# Patient Record
Sex: Male | Born: 1960 | Race: White | Hispanic: No | Marital: Married | Smoking: Never smoker
Health system: Southern US, Community
[De-identification: ages and names within clinical notes are randomized; demographics above are authoritative.]

## PROBLEM LIST (undated history)

## (undated) DIAGNOSIS — M7700 Medial epicondylitis, unspecified elbow: Secondary | ICD-10-CM

## (undated) DIAGNOSIS — IMO0001 Reserved for inherently not codable concepts without codable children: Secondary | ICD-10-CM

## (undated) DIAGNOSIS — M545 Low back pain, unspecified: Secondary | ICD-10-CM

## (undated) DIAGNOSIS — K635 Polyp of colon: Secondary | ICD-10-CM

## (undated) DIAGNOSIS — H8101 Meniere's disease, right ear: Secondary | ICD-10-CM

## (undated) DIAGNOSIS — K219 Gastro-esophageal reflux disease without esophagitis: Secondary | ICD-10-CM

## (undated) DIAGNOSIS — K649 Unspecified hemorrhoids: Secondary | ICD-10-CM

## (undated) HISTORY — DX: Gastro-esophageal reflux disease without esophagitis: K21.9

## (undated) HISTORY — DX: Low back pain: M54.5

## (undated) HISTORY — DX: Reserved for inherently not codable concepts without codable children: IMO0001

## (undated) HISTORY — DX: Polyp of colon: K63.5

## (undated) HISTORY — PX: WISDOM TOOTH EXTRACTION: SHX21

## (undated) HISTORY — DX: Meniere's disease, right ear: H81.01

## (undated) HISTORY — DX: Unspecified hemorrhoids: K64.9

## (undated) HISTORY — DX: Medial epicondylitis, unspecified elbow: M77.00

## (undated) HISTORY — DX: Low back pain, unspecified: M54.50

---

## 2002-06-04 HISTORY — PX: COLONOSCOPY: SHX174

## 2003-01-08 DIAGNOSIS — H8101 Meniere's disease, right ear: Secondary | ICD-10-CM

## 2003-01-08 HISTORY — DX: Meniere's disease, right ear: H81.01

## 2003-05-31 ENCOUNTER — Ambulatory Visit (HOSPITAL_COMMUNITY): Admission: RE | Admit: 2003-05-31 | Discharge: 2003-05-31 | Payer: Self-pay | Admitting: Internal Medicine

## 2004-12-25 IMAGING — US US ABDOMEN COMPLETE
1 series · 14 of 25 positions shown · non-contrast
Comparison: none

CLINICAL DATA: Patient has abdominal pain.  
ULTRASOUND OF THE ABDOMEN COMPLETE
The gallbladder is not optimally dilated  without  definite stones.  Gallbladder wall is normal.  Common bile duct is normal measuring 4-5 mm.  Liver, IVC, pancreas and spleen are unremarkable.  Right kidney measures 10.2 cm and the left 10.8 cm.  No hydronephrosis.  Aorta is normal measuring 2 cm.  
IMPRESSION 
There is no abnormality.  No gallstones.

[Series 1: unknown · 0.33mm/px · 14 of 89 slices shown]
[im 1/89]
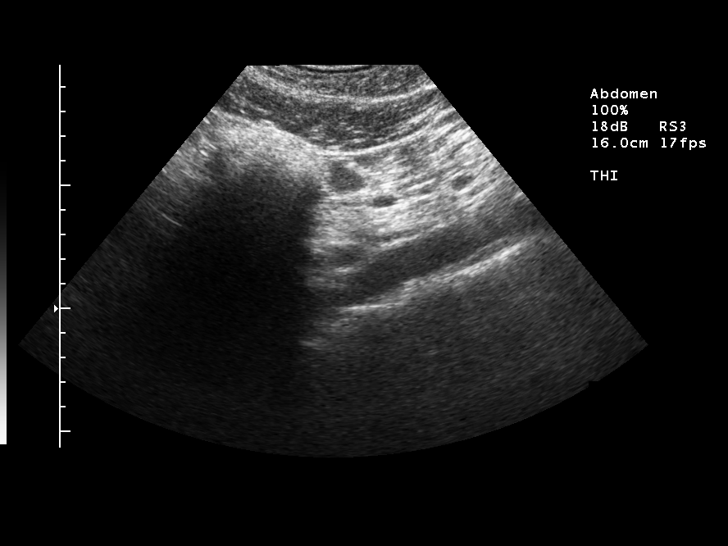
[im 8/89]
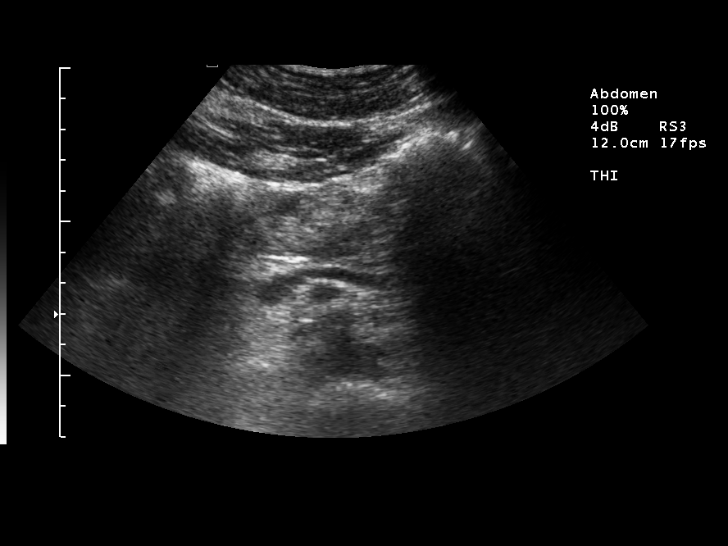
[im 15/89]
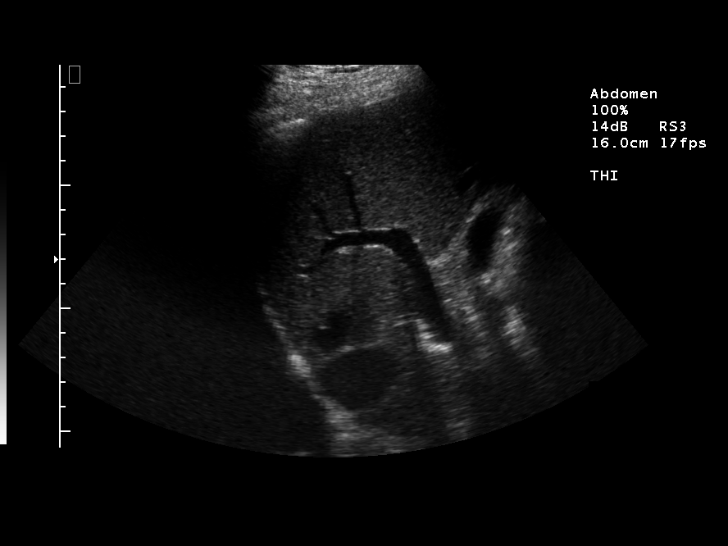
[im 23/89]
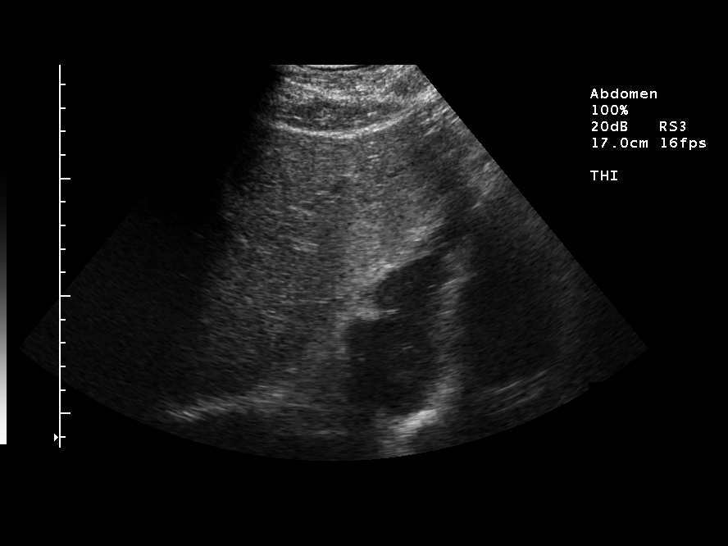
[im 30/89]
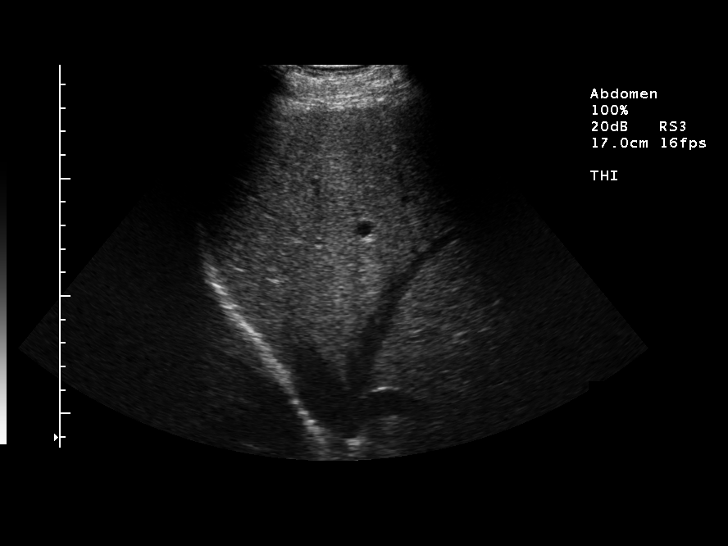
[im 34/89]
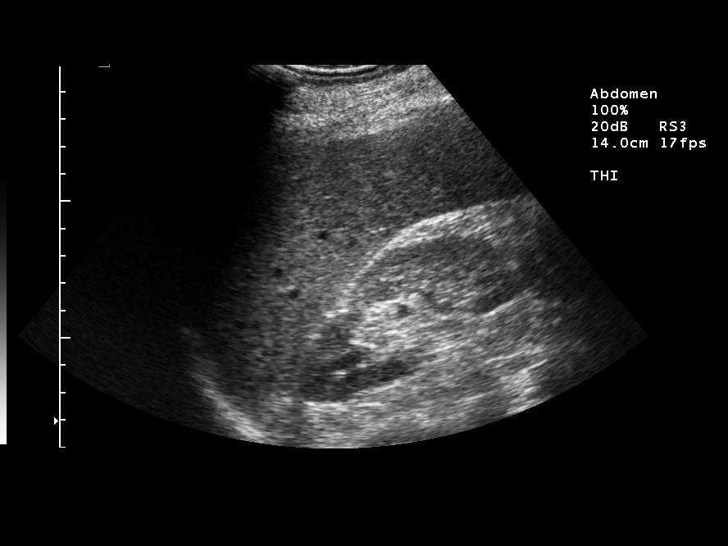
[im 41/89]
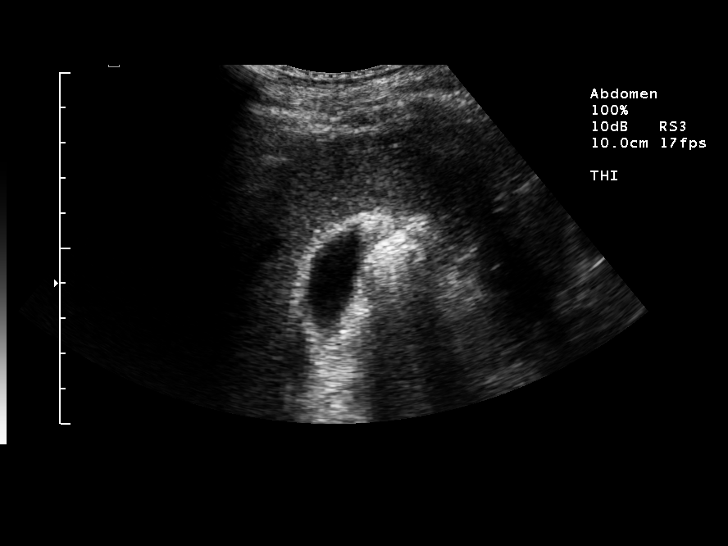
[im 48/89]
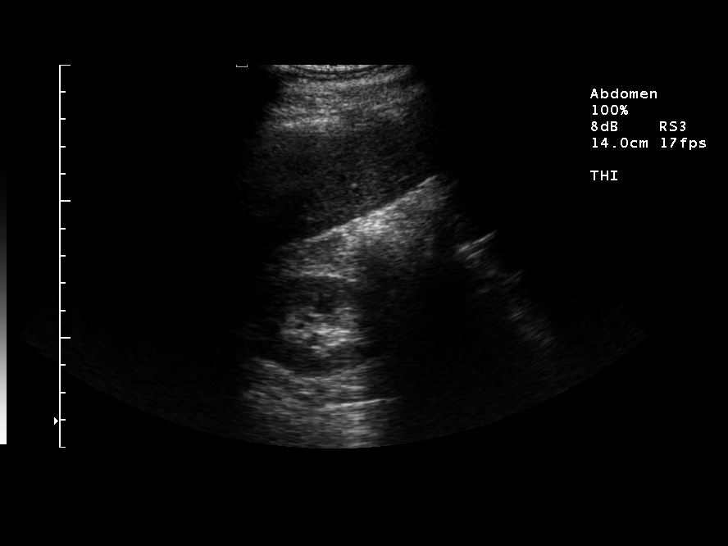
[im 56/89]
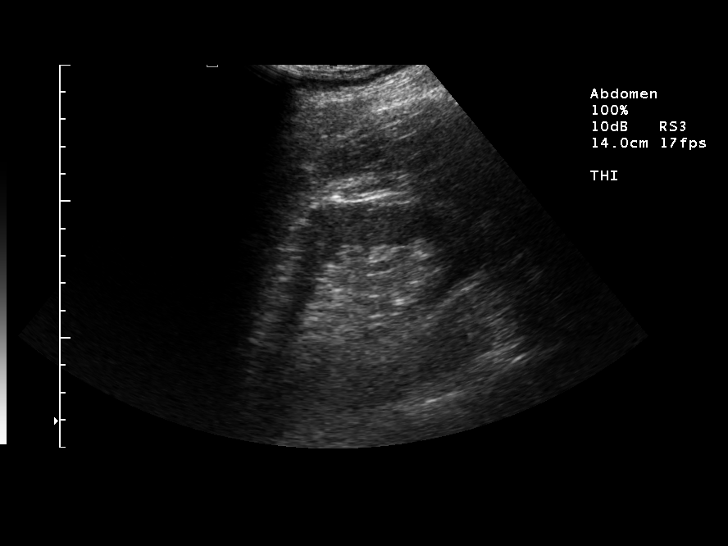
[im 59/89]
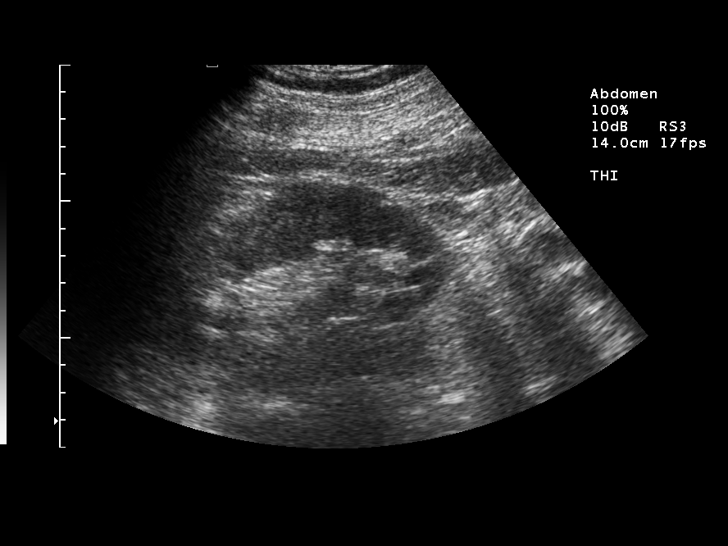
[im 67/89]
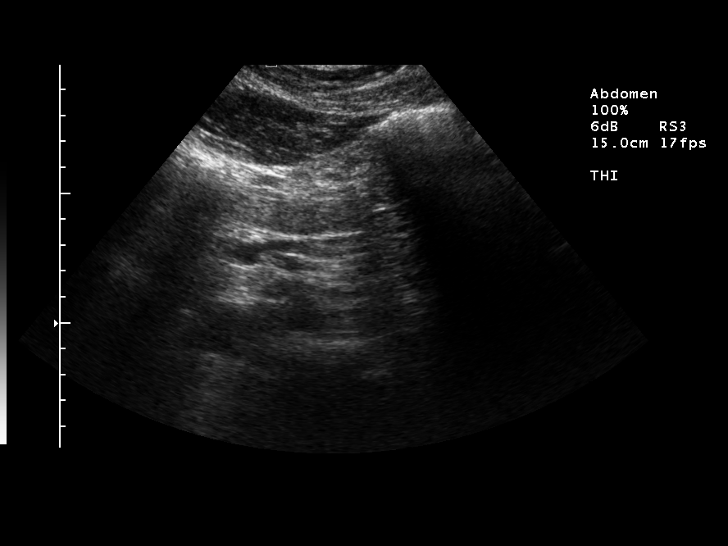
[im 74/89]
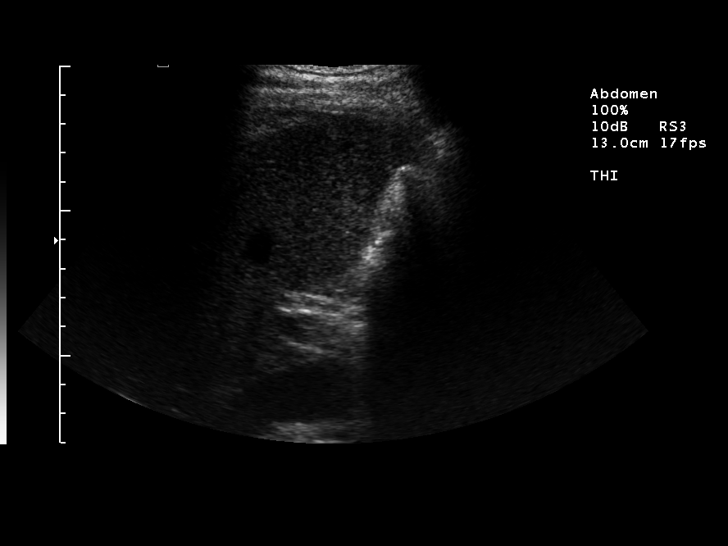
[im 81/89]
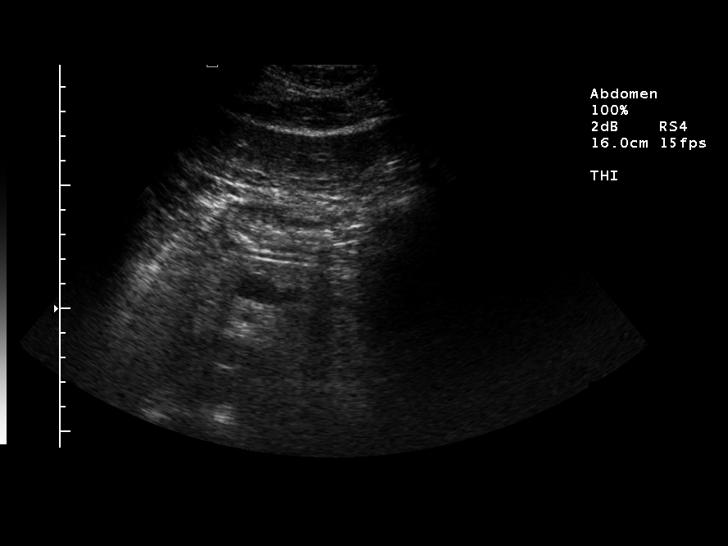
[im 89/89]
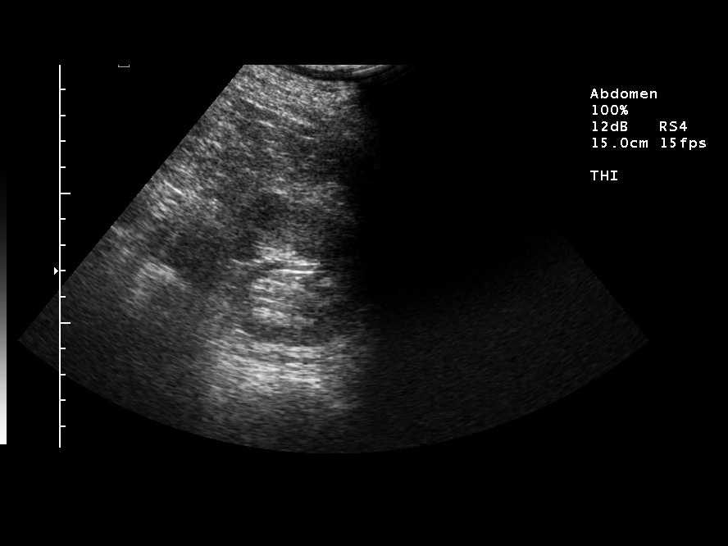

[14 of 25 positions shown; findings below may reference images not displayed]

## 2008-07-29 ENCOUNTER — Encounter: Admission: RE | Admit: 2008-07-29 | Discharge: 2008-07-29 | Payer: Self-pay | Admitting: Orthopedic Surgery

## 2008-08-23 ENCOUNTER — Encounter: Admission: RE | Admit: 2008-08-23 | Discharge: 2008-08-23 | Payer: Self-pay | Admitting: Orthopedic Surgery

## 2010-01-28 ENCOUNTER — Encounter: Payer: Self-pay | Admitting: Internal Medicine

## 2010-01-29 ENCOUNTER — Encounter: Payer: Self-pay | Admitting: Orthopedic Surgery

## 2012-10-09 ENCOUNTER — Ambulatory Visit (INDEPENDENT_AMBULATORY_CARE_PROVIDER_SITE_OTHER): Payer: BC Managed Care – PPO | Admitting: Psychology

## 2012-10-09 DIAGNOSIS — F331 Major depressive disorder, recurrent, moderate: Secondary | ICD-10-CM

## 2012-10-22 ENCOUNTER — Ambulatory Visit (INDEPENDENT_AMBULATORY_CARE_PROVIDER_SITE_OTHER): Payer: BC Managed Care – PPO | Admitting: Psychology

## 2012-10-22 DIAGNOSIS — F331 Major depressive disorder, recurrent, moderate: Secondary | ICD-10-CM

## 2012-12-11 ENCOUNTER — Ambulatory Visit (INDEPENDENT_AMBULATORY_CARE_PROVIDER_SITE_OTHER): Payer: BC Managed Care – PPO | Admitting: Psychology

## 2012-12-11 DIAGNOSIS — F331 Major depressive disorder, recurrent, moderate: Secondary | ICD-10-CM

## 2012-12-24 ENCOUNTER — Encounter: Payer: Self-pay | Admitting: General Surgery

## 2012-12-24 ENCOUNTER — Ambulatory Visit (INDEPENDENT_AMBULATORY_CARE_PROVIDER_SITE_OTHER): Payer: BC Managed Care – PPO | Admitting: Cardiology

## 2012-12-24 VITALS — BP 140/86 | HR 58 | Ht 70.5 in | Wt 168.0 lb

## 2012-12-24 DIAGNOSIS — R002 Palpitations: Secondary | ICD-10-CM

## 2012-12-24 DIAGNOSIS — R079 Chest pain, unspecified: Secondary | ICD-10-CM | POA: Insufficient documentation

## 2012-12-24 NOTE — Patient Instructions (Addendum)
Dr. Mayford Knife would like for you to have your heart monitor put on today.  Your physician recommends that you schedule a follow-up appointment during the first week on January 2015.

## 2012-12-28 ENCOUNTER — Telehealth: Payer: Self-pay | Admitting: Cardiology

## 2012-12-28 NOTE — Telephone Encounter (Signed)
Please ask one of the monitor techs to call him to answer his questions

## 2012-12-28 NOTE — Telephone Encounter (Signed)
To Burnett Harry and Orpha Bur to advise

## 2012-12-28 NOTE — Telephone Encounter (Signed)
New Problem:  Pt states he has questions about his heart monitor. Pt states [place]. Pt is asking if that will pose a problem. Pt would like a call back.

## 2012-12-28 NOTE — Telephone Encounter (Signed)
To Dr Mayford Knife to advise. OK to take out of country?

## 2012-12-29 ENCOUNTER — Encounter: Payer: Self-pay | Admitting: *Deleted

## 2012-12-29 ENCOUNTER — Encounter (INDEPENDENT_AMBULATORY_CARE_PROVIDER_SITE_OTHER): Payer: BC Managed Care – PPO

## 2012-12-29 DIAGNOSIS — R002 Palpitations: Secondary | ICD-10-CM

## 2012-12-29 NOTE — Progress Notes (Signed)
Patient ID: Oscar Niemann., male   DOB: 1960-06-04, 52 y.o.   MRN: [id number] [employer] 30 day cardiac event monitor applied to patient.  Patient will be out of country 12/31/2012 thru 01/04/2014 and off monitor device.

## 2013-01-04 ENCOUNTER — Encounter: Payer: Self-pay | Admitting: General Surgery

## 2013-01-15 ENCOUNTER — Ambulatory Visit (INDEPENDENT_AMBULATORY_CARE_PROVIDER_SITE_OTHER): Payer: BC Managed Care – PPO | Admitting: Cardiology

## 2013-01-15 ENCOUNTER — Encounter: Payer: Self-pay | Admitting: Cardiology

## 2013-01-15 VITALS — BP 130/84 | HR 51 | Ht 69.5 in | Wt 177.0 lb

## 2013-01-15 DIAGNOSIS — R002 Palpitations: Secondary | ICD-10-CM

## 2013-01-15 NOTE — Progress Notes (Signed)
  [address] [place], [place]  [address] Phone: [phone] Fax:  [phone]) [phone]  Date:  01/15/2013   ID:  Oscar Niemannlbert R Belasco Jr., DOB 1960/11/15, MRN [id number]  PCP:  Pearla DubonnetGATES,ROBERT NEVILL, MD  Cardiologist:  Armanda Magicraci Valente Fosberg, MD     History of Present Illness:  This is a 53yo WM with a history of atypical CP in 2012 with normal nusclear stress test who called in a few weeks ago with new onset palpitations.  An event monitor was ordered and he presents back today for followup.  After starting to wear the heart monitor he has not had any further palpitations.  He drinks 2 cups of coffee daily.  He denies any chest pain or SOB.     Wt Readings from Last 3 Encounters:  01/04/13 177 lb (80.287 kg)  12/24/12 177 lb (80.287 kg)  12/24/12 168 lb (76.204 kg)     Past Medical History  Diagnosis Date  . Hemorrhoids   . Colon polyp     normal colonoscopy 06/04/2002/ Latest colonscopy per Dr Ewing SchleinMagod  . Reflux   . Lower back pain   . Medial epicondylitis     Left  . Endolymphatic hydrops of right ear 2005    Current Outpatient Prescriptions  Medication Sig Dispense Refill  . aspirin 81 MG tablet Take 81 mg by mouth daily.      . pantoprazole (PROTONIX) 40 MG tablet Take 40 mg by mouth as needed.      . rosuvastatin (CRESTOR) 10 MG tablet Take 10 mg by mouth daily.       No current facility-administered medications for this visit.    Allergies:   No Known Allergies  Social History:  The patient  reports that he has never smoked. He does not have any smokeless tobacco history on file. He reports that he drinks alcohol. He reports that he does not use illicit drugs.   Family History:  The patient's family history includes Coronary artery disease in his father.   ROS:  Please see the history of present illness.      All other systems reviewed and negative.   PHYSICAL EXAM: VS:  There were no vitals taken for this visit. Well nourished, well developed, in no acute distress HEENT:  normal Neck: no JVD Cardiac:  normal S1, S2; RRR; no murmur Lungs:  clear to auscultation bilaterally, no wheezing, rhonchi or rales Abd: soft, nontender, no hepatomegaly Ext: no edema Skin: warm and dry Neuro:  CNs 2-12 intact, no focal abnormalities noted    ASSESSMENT AND PLAN:  1. Palpitations - event monitor results pending  - I will call him with the results.  The palpitations have resolved at this time  Followup with me PRN  Signed, Armanda Magicraci Makenli Derstine, MD 01/15/2013 8:55 AM

## 2013-01-15 NOTE — Patient Instructions (Signed)
Your physician recommends that you continue on your current medications as directed. Please refer to the Current Medication list given to you today.   Your physician recommends that you schedule a follow-up appointment as needed  

## 2013-02-08 ENCOUNTER — Telehealth: Payer: Self-pay | Admitting: Cardiology

## 2013-02-08 NOTE — Telephone Encounter (Signed)
Please let patient know that heart monitor showed normal rhythm with HR from 56-127bpm

## 2013-02-08 NOTE — Telephone Encounter (Signed)
LVM for pt to return call

## 2013-02-09 NOTE — Telephone Encounter (Signed)
Pt made aware of the results

## 2013-03-05 ENCOUNTER — Telehealth: Payer: Self-pay | Admitting: Cardiology

## 2013-03-05 ENCOUNTER — Encounter: Payer: Self-pay | Admitting: *Deleted

## 2013-03-05 NOTE — Telephone Encounter (Signed)
Walk-In Patient form received. Taken to [place] in the treadmill room.

## 2013-03-05 NOTE — Progress Notes (Signed)
Patient ID: Oscar NiemannAlbert R Salim Jr., male   DOB: [birth year], 53 y.o.   MRN: [id number] [facility]Loren [facility] called and informed patient came in with  A [employer] Tracking #[id number].  She will notify [employer] that [name] monitor has been shipped.  I called [employer] to verify package tracking # above was in transit.  Tracking ticket placed in special circumstance folder in monitor room for next 60 days.

## 2013-03-19 ENCOUNTER — Ambulatory Visit (INDEPENDENT_AMBULATORY_CARE_PROVIDER_SITE_OTHER): Payer: BC Managed Care – PPO | Admitting: Psychology

## 2013-03-19 DIAGNOSIS — F331 Major depressive disorder, recurrent, moderate: Secondary | ICD-10-CM

## 2013-03-23 ENCOUNTER — Ambulatory Visit (INDEPENDENT_AMBULATORY_CARE_PROVIDER_SITE_OTHER): Payer: BC Managed Care – PPO | Admitting: Psychology

## 2013-03-23 DIAGNOSIS — F331 Major depressive disorder, recurrent, moderate: Secondary | ICD-10-CM

## 2013-04-06 ENCOUNTER — Ambulatory Visit (INDEPENDENT_AMBULATORY_CARE_PROVIDER_SITE_OTHER): Payer: BC Managed Care – PPO | Admitting: Psychology

## 2013-04-06 DIAGNOSIS — F331 Major depressive disorder, recurrent, moderate: Secondary | ICD-10-CM

## 2013-08-12 ENCOUNTER — Encounter: Payer: Self-pay | Admitting: Cardiology

## 2013-08-12 DIAGNOSIS — K649 Unspecified hemorrhoids: Secondary | ICD-10-CM | POA: Insufficient documentation

## 2013-08-12 DIAGNOSIS — M545 Low back pain, unspecified: Secondary | ICD-10-CM | POA: Insufficient documentation

## 2013-08-12 DIAGNOSIS — M7700 Medial epicondylitis, unspecified elbow: Secondary | ICD-10-CM | POA: Insufficient documentation

## 2013-08-12 DIAGNOSIS — IMO0001 Reserved for inherently not codable concepts without codable children: Secondary | ICD-10-CM | POA: Insufficient documentation

## 2013-08-12 DIAGNOSIS — K219 Gastro-esophageal reflux disease without esophagitis: Secondary | ICD-10-CM

## 2013-08-12 DIAGNOSIS — H8101 Meniere's disease, right ear: Secondary | ICD-10-CM | POA: Insufficient documentation

## 2013-09-09 ENCOUNTER — Ambulatory Visit (INDEPENDENT_AMBULATORY_CARE_PROVIDER_SITE_OTHER): Payer: BC Managed Care – PPO | Admitting: Psychology

## 2013-09-09 DIAGNOSIS — F331 Major depressive disorder, recurrent, moderate: Secondary | ICD-10-CM

## 2013-09-23 ENCOUNTER — Ambulatory Visit: Payer: BC Managed Care – PPO | Admitting: Psychology

## 2015-07-10 DIAGNOSIS — L02231 Carbuncle of abdominal wall: Secondary | ICD-10-CM | POA: Diagnosis not present

## 2015-07-10 DIAGNOSIS — R030 Elevated blood-pressure reading, without diagnosis of hypertension: Secondary | ICD-10-CM | POA: Diagnosis not present

## 2015-10-04 DIAGNOSIS — B86 Scabies: Secondary | ICD-10-CM | POA: Diagnosis not present

## 2015-11-13 DIAGNOSIS — Z23 Encounter for immunization: Secondary | ICD-10-CM | POA: Diagnosis not present

## 2015-12-14 DIAGNOSIS — H01001 Unspecified blepharitis right upper eyelid: Secondary | ICD-10-CM | POA: Diagnosis not present

## 2015-12-14 DIAGNOSIS — H01004 Unspecified blepharitis left upper eyelid: Secondary | ICD-10-CM | POA: Diagnosis not present

## 2015-12-14 DIAGNOSIS — H5213 Myopia, bilateral: Secondary | ICD-10-CM | POA: Diagnosis not present

## 2016-01-12 DIAGNOSIS — Z658 Other specified problems related to psychosocial circumstances: Secondary | ICD-10-CM | POA: Diagnosis not present

## 2016-01-12 DIAGNOSIS — R739 Hyperglycemia, unspecified: Secondary | ICD-10-CM | POA: Diagnosis not present

## 2016-01-12 DIAGNOSIS — E785 Hyperlipidemia, unspecified: Secondary | ICD-10-CM | POA: Diagnosis not present

## 2016-01-12 DIAGNOSIS — K210 Gastro-esophageal reflux disease with esophagitis: Secondary | ICD-10-CM | POA: Diagnosis not present

## 2016-01-12 DIAGNOSIS — Z0001 Encounter for general adult medical examination with abnormal findings: Secondary | ICD-10-CM | POA: Diagnosis not present

## 2016-05-07 DIAGNOSIS — H0014 Chalazion left upper eyelid: Secondary | ICD-10-CM | POA: Diagnosis not present

## 2017-02-26 DIAGNOSIS — L309 Dermatitis, unspecified: Secondary | ICD-10-CM | POA: Diagnosis not present

## 2017-02-26 DIAGNOSIS — Z0001 Encounter for general adult medical examination with abnormal findings: Secondary | ICD-10-CM | POA: Diagnosis not present

## 2017-02-26 DIAGNOSIS — E785 Hyperlipidemia, unspecified: Secondary | ICD-10-CM | POA: Diagnosis not present

## 2017-04-18 DIAGNOSIS — K2900 Acute gastritis without bleeding: Secondary | ICD-10-CM | POA: Diagnosis not present

## 2017-05-16 DIAGNOSIS — Z0189 Encounter for other specified special examinations: Secondary | ICD-10-CM | POA: Diagnosis not present

## 2017-05-21 DIAGNOSIS — B309 Viral conjunctivitis, unspecified: Secondary | ICD-10-CM | POA: Diagnosis not present

## 2017-05-21 DIAGNOSIS — H0014 Chalazion left upper eyelid: Secondary | ICD-10-CM | POA: Diagnosis not present

## 2017-08-28 DIAGNOSIS — H0014 Chalazion left upper eyelid: Secondary | ICD-10-CM | POA: Diagnosis not present

## 2017-08-28 DIAGNOSIS — S0501XA Injury of conjunctiva and corneal abrasion without foreign body, right eye, initial encounter: Secondary | ICD-10-CM | POA: Diagnosis not present

## 2017-08-28 DIAGNOSIS — H5213 Myopia, bilateral: Secondary | ICD-10-CM | POA: Diagnosis not present

## 2017-08-28 DIAGNOSIS — H25093 Other age-related incipient cataract, bilateral: Secondary | ICD-10-CM | POA: Diagnosis not present

## 2017-09-01 DIAGNOSIS — H0014 Chalazion left upper eyelid: Secondary | ICD-10-CM | POA: Diagnosis not present

## 2017-09-09 DIAGNOSIS — Z23 Encounter for immunization: Secondary | ICD-10-CM | POA: Diagnosis not present

## 2017-10-13 DIAGNOSIS — H2513 Age-related nuclear cataract, bilateral: Secondary | ICD-10-CM | POA: Diagnosis not present

## 2017-10-13 DIAGNOSIS — H1045 Other chronic allergic conjunctivitis: Secondary | ICD-10-CM | POA: Diagnosis not present

## 2017-10-13 DIAGNOSIS — H5213 Myopia, bilateral: Secondary | ICD-10-CM | POA: Diagnosis not present

## 2017-12-19 DIAGNOSIS — L821 Other seborrheic keratosis: Secondary | ICD-10-CM | POA: Diagnosis not present

## 2017-12-19 DIAGNOSIS — D235 Other benign neoplasm of skin of trunk: Secondary | ICD-10-CM | POA: Diagnosis not present

## 2017-12-19 DIAGNOSIS — L570 Actinic keratosis: Secondary | ICD-10-CM | POA: Diagnosis not present

## 2017-12-19 DIAGNOSIS — D1801 Hemangioma of skin and subcutaneous tissue: Secondary | ICD-10-CM | POA: Diagnosis not present

## 2018-01-12 DIAGNOSIS — F321 Major depressive disorder, single episode, moderate: Secondary | ICD-10-CM | POA: Diagnosis not present

## 2018-01-15 DIAGNOSIS — F321 Major depressive disorder, single episode, moderate: Secondary | ICD-10-CM | POA: Diagnosis not present

## 2018-01-20 DIAGNOSIS — F321 Major depressive disorder, single episode, moderate: Secondary | ICD-10-CM | POA: Diagnosis not present

## 2018-01-22 DIAGNOSIS — F321 Major depressive disorder, single episode, moderate: Secondary | ICD-10-CM | POA: Diagnosis not present

## 2018-01-26 DIAGNOSIS — F321 Major depressive disorder, single episode, moderate: Secondary | ICD-10-CM | POA: Diagnosis not present

## 2018-01-28 DIAGNOSIS — F321 Major depressive disorder, single episode, moderate: Secondary | ICD-10-CM | POA: Diagnosis not present

## 2018-02-12 DIAGNOSIS — L719 Rosacea, unspecified: Secondary | ICD-10-CM | POA: Diagnosis not present

## 2018-02-12 DIAGNOSIS — L570 Actinic keratosis: Secondary | ICD-10-CM | POA: Diagnosis not present

## 2018-06-16 DIAGNOSIS — R739 Hyperglycemia, unspecified: Secondary | ICD-10-CM | POA: Diagnosis not present

## 2018-06-16 DIAGNOSIS — E785 Hyperlipidemia, unspecified: Secondary | ICD-10-CM | POA: Diagnosis not present

## 2018-06-16 DIAGNOSIS — I10 Essential (primary) hypertension: Secondary | ICD-10-CM | POA: Diagnosis not present

## 2018-06-22 DIAGNOSIS — Z125 Encounter for screening for malignant neoplasm of prostate: Secondary | ICD-10-CM | POA: Diagnosis not present

## 2018-06-22 DIAGNOSIS — R739 Hyperglycemia, unspecified: Secondary | ICD-10-CM | POA: Diagnosis not present

## 2018-06-22 DIAGNOSIS — R202 Paresthesia of skin: Secondary | ICD-10-CM | POA: Diagnosis not present

## 2018-06-22 DIAGNOSIS — E785 Hyperlipidemia, unspecified: Secondary | ICD-10-CM | POA: Diagnosis not present

## 2018-06-23 DIAGNOSIS — H1045 Other chronic allergic conjunctivitis: Secondary | ICD-10-CM | POA: Diagnosis not present

## 2018-06-23 DIAGNOSIS — H01023 Squamous blepharitis right eye, unspecified eyelid: Secondary | ICD-10-CM | POA: Diagnosis not present

## 2018-06-23 DIAGNOSIS — H01026 Squamous blepharitis left eye, unspecified eyelid: Secondary | ICD-10-CM | POA: Diagnosis not present

## 2018-07-24 DIAGNOSIS — F4323 Adjustment disorder with mixed anxiety and depressed mood: Secondary | ICD-10-CM | POA: Diagnosis not present

## 2018-07-27 DIAGNOSIS — F4323 Adjustment disorder with mixed anxiety and depressed mood: Secondary | ICD-10-CM | POA: Diagnosis not present

## 2018-07-29 DIAGNOSIS — F4323 Adjustment disorder with mixed anxiety and depressed mood: Secondary | ICD-10-CM | POA: Diagnosis not present

## 2018-08-04 DIAGNOSIS — F4323 Adjustment disorder with mixed anxiety and depressed mood: Secondary | ICD-10-CM | POA: Diagnosis not present

## 2018-08-20 DIAGNOSIS — F4323 Adjustment disorder with mixed anxiety and depressed mood: Secondary | ICD-10-CM | POA: Diagnosis not present

## 2018-08-28 DIAGNOSIS — F4323 Adjustment disorder with mixed anxiety and depressed mood: Secondary | ICD-10-CM | POA: Diagnosis not present

## 2018-12-15 DIAGNOSIS — F4323 Adjustment disorder with mixed anxiety and depressed mood: Secondary | ICD-10-CM | POA: Diagnosis not present

## 2018-12-23 DIAGNOSIS — H02886 Meibomian gland dysfunction of left eye, unspecified eyelid: Secondary | ICD-10-CM | POA: Diagnosis not present

## 2018-12-23 DIAGNOSIS — H02883 Meibomian gland dysfunction of right eye, unspecified eyelid: Secondary | ICD-10-CM | POA: Diagnosis not present

## 2018-12-24 DIAGNOSIS — F4323 Adjustment disorder with mixed anxiety and depressed mood: Secondary | ICD-10-CM | POA: Diagnosis not present

## 2019-01-06 DIAGNOSIS — F4323 Adjustment disorder with mixed anxiety and depressed mood: Secondary | ICD-10-CM | POA: Diagnosis not present

## 2019-03-30 DIAGNOSIS — D485 Neoplasm of uncertain behavior of skin: Secondary | ICD-10-CM | POA: Diagnosis not present

## 2019-03-30 DIAGNOSIS — L309 Dermatitis, unspecified: Secondary | ICD-10-CM | POA: Diagnosis not present

## 2019-03-30 DIAGNOSIS — I308 Other forms of acute pericarditis: Secondary | ICD-10-CM | POA: Diagnosis not present

## 2019-04-08 DIAGNOSIS — M25572 Pain in left ankle and joints of left foot: Secondary | ICD-10-CM | POA: Diagnosis not present

## 2019-04-14 DIAGNOSIS — J020 Streptococcal pharyngitis: Secondary | ICD-10-CM | POA: Diagnosis not present

## 2019-04-29 DIAGNOSIS — L509 Urticaria, unspecified: Secondary | ICD-10-CM | POA: Diagnosis not present

## 2019-04-29 DIAGNOSIS — Z3202 Encounter for pregnancy test, result negative: Secondary | ICD-10-CM | POA: Diagnosis not present

## 2019-04-29 DIAGNOSIS — Z113 Encounter for screening for infections with a predominantly sexual mode of transmission: Secondary | ICD-10-CM | POA: Diagnosis not present

## 2019-04-30 DIAGNOSIS — E7800 Pure hypercholesterolemia, unspecified: Secondary | ICD-10-CM | POA: Diagnosis not present

## 2019-04-30 DIAGNOSIS — M67472 Ganglion, left ankle and foot: Secondary | ICD-10-CM | POA: Diagnosis not present

## 2019-04-30 DIAGNOSIS — G5762 Lesion of plantar nerve, left lower limb: Secondary | ICD-10-CM | POA: Diagnosis not present

## 2019-07-16 DIAGNOSIS — J00 Acute nasopharyngitis [common cold]: Secondary | ICD-10-CM | POA: Diagnosis not present

## 2019-07-23 DIAGNOSIS — R110 Nausea: Secondary | ICD-10-CM | POA: Diagnosis not present

## 2019-07-23 DIAGNOSIS — H66003 Acute suppurative otitis media without spontaneous rupture of ear drum, bilateral: Secondary | ICD-10-CM | POA: Diagnosis not present

## 2019-08-23 DIAGNOSIS — L578 Other skin changes due to chronic exposure to nonionizing radiation: Secondary | ICD-10-CM | POA: Diagnosis not present

## 2019-08-23 DIAGNOSIS — L814 Other melanin hyperpigmentation: Secondary | ICD-10-CM | POA: Diagnosis not present

## 2019-08-23 DIAGNOSIS — L820 Inflamed seborrheic keratosis: Secondary | ICD-10-CM | POA: Diagnosis not present

## 2019-08-23 DIAGNOSIS — Z23 Encounter for immunization: Secondary | ICD-10-CM | POA: Diagnosis not present

## 2019-08-23 DIAGNOSIS — E785 Hyperlipidemia, unspecified: Secondary | ICD-10-CM | POA: Diagnosis not present

## 2019-08-23 DIAGNOSIS — I10 Essential (primary) hypertension: Secondary | ICD-10-CM | POA: Diagnosis not present

## 2019-08-23 DIAGNOSIS — D485 Neoplasm of uncertain behavior of skin: Secondary | ICD-10-CM | POA: Diagnosis not present

## 2019-08-23 DIAGNOSIS — D229 Melanocytic nevi, unspecified: Secondary | ICD-10-CM | POA: Diagnosis not present

## 2019-08-23 DIAGNOSIS — Z0000 Encounter for general adult medical examination without abnormal findings: Secondary | ICD-10-CM | POA: Diagnosis not present

## 2019-08-23 DIAGNOSIS — Z79899 Other long term (current) drug therapy: Secondary | ICD-10-CM | POA: Diagnosis not present

## 2019-09-21 DIAGNOSIS — K5730 Diverticulosis of large intestine without perforation or abscess without bleeding: Secondary | ICD-10-CM | POA: Diagnosis not present

## 2019-09-21 DIAGNOSIS — Z86010 Personal history of colonic polyps: Secondary | ICD-10-CM | POA: Diagnosis not present

## 2019-09-21 DIAGNOSIS — K2100 Gastro-esophageal reflux disease with esophagitis, without bleeding: Secondary | ICD-10-CM | POA: Diagnosis not present

## 2019-09-21 DIAGNOSIS — R1310 Dysphagia, unspecified: Secondary | ICD-10-CM | POA: Diagnosis not present

## 2019-09-21 DIAGNOSIS — Z1211 Encounter for screening for malignant neoplasm of colon: Secondary | ICD-10-CM | POA: Diagnosis not present

## 2019-10-22 DIAGNOSIS — M7581 Other shoulder lesions, right shoulder: Secondary | ICD-10-CM | POA: Diagnosis not present

## 2019-10-22 DIAGNOSIS — M25511 Pain in right shoulder: Secondary | ICD-10-CM | POA: Diagnosis not present

## 2019-11-08 DIAGNOSIS — M7581 Other shoulder lesions, right shoulder: Secondary | ICD-10-CM | POA: Diagnosis not present

## 2019-11-08 DIAGNOSIS — M25511 Pain in right shoulder: Secondary | ICD-10-CM | POA: Diagnosis not present

## 2019-11-15 DIAGNOSIS — M25511 Pain in right shoulder: Secondary | ICD-10-CM | POA: Diagnosis not present
